# Patient Record
Sex: Female | Born: 2000 | Race: Black or African American | Hispanic: No | Marital: Single | Smoking: Current every day smoker
Health system: Southern US, Community
[De-identification: ages and names within clinical notes are randomized; demographics above are authoritative.]

## PROBLEM LIST (undated history)

## (undated) DIAGNOSIS — F1290 Cannabis use, unspecified, uncomplicated: Secondary | ICD-10-CM

## (undated) HISTORY — PX: NO PAST SURGERIES: SHX2092

---
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
## 2021-01-19 NOTE — Telephone Encounter (Signed)
Called [facility] back. The doctor wanted to make sure they had to correct information from the patient. The patient had told them she was waiting for [place] to mail her another monitor. Advised that patient was mailed a second monitor and already worn it and we are in the process of uploading the results. She was appreciative for the information. She will await the results to be uploaded and check in CareEverywhere for them.  I will upload tomorrow when I am at a computer where I can do that.

## 2021-01-19 NOTE — Telephone Encounter (Signed)
Please call to discuss monitor.[facility] is calling and states that patinetn is stating mmonitor was to be placed in the mail in December, and she has not received . Please call to discuss.

## 2021-01-19 NOTE — Telephone Encounter (Signed)
[facility] is returning your call to discuss in further detail.

## 2021-01-19 NOTE — Telephone Encounter (Signed)
Second report available on ZIO suite, it posted on ZIO on 01/08/21.  Will upload to Epic.   Left message with [facility] that we are in the process of uploading the second monitor and to call us back if they have any further questions.

## 2021-01-20 NOTE — Telephone Encounter (Signed)
Second monitor uploaded and assigned to Dr Mariah Milling to result.

## 2021-01-20 NOTE — Addendum Note (Signed)
Addended by: Jeannetta Nap on: 01/20/2021 08:22 AM   Modules accepted: Orders

## 2021-01-23 NOTE — Progress Notes (Signed)
Results reviewed with patient and she verbalized understanding with no further questions at this time.  °

## 2021-02-16 ENCOUNTER — Inpatient Hospital Stay: Payer: Self-pay | Admitting: Neurology

## 2021-05-14 ENCOUNTER — Other Ambulatory Visit: Payer: Self-pay

## 2021-05-14 ENCOUNTER — Emergency Department: Payer: Medicaid Other

## 2021-05-14 DIAGNOSIS — M79602 Pain in left arm: Secondary | ICD-10-CM | POA: Insufficient documentation

## 2021-05-14 DIAGNOSIS — R079 Chest pain, unspecified: Secondary | ICD-10-CM | POA: Insufficient documentation

## 2021-05-14 DIAGNOSIS — F17290 Nicotine dependence, other tobacco product, uncomplicated: Secondary | ICD-10-CM | POA: Insufficient documentation

## 2021-05-14 DIAGNOSIS — Z7982 Long term (current) use of aspirin: Secondary | ICD-10-CM | POA: Insufficient documentation

## 2021-05-14 LAB — BASIC METABOLIC PANEL
Anion gap: 8 (ref 5–15)
BUN: 5 mg/dL — ABNORMAL LOW (ref 6–20)
CO2: 25 mmol/L (ref 22–32)
Calcium: 9.1 mg/dL (ref 8.9–10.3)
Chloride: 106 mmol/L (ref 98–111)
Creatinine, Ser: 0.82 mg/dL (ref 0.44–1.00)
GFR, Estimated: >60 mL/min (ref >60)
Glucose, Bld: 82 mg/dL (ref 70–99)
Potassium: 3.5 mmol/L (ref 3.5–5.1)
Sodium: 139 mmol/L (ref 135–145)

## 2021-05-14 LAB — CBC
HCT: 38.2 % (ref 36.0–46.0)
Hemoglobin: 13.2 g/dL (ref 12.0–15.0)
MCH: 32.3 pg (ref 26.0–34.0)
MCHC: 34.6 g/dL (ref 30.0–36.0)
MCV: 93.4 fL (ref 80.0–100.0)
Platelets: 286 10*3/uL (ref 150–400)
RBC: 4.09 MIL/uL (ref 3.87–5.11)
RDW: 13.7 % (ref 11.5–15.5)
WBC: 4.2 10*3/uL (ref 4.0–10.5)
nRBC: 0 % (ref 0.0–0.2)

## 2021-05-14 LAB — TROPONIN I (HIGH SENSITIVITY): Troponin I (High Sensitivity): 4 ng/L (ref <18)

## 2021-05-14 IMAGING — CR DG CHEST 2V
1 series · 2 of 2 positions shown · non-contrast
Comparison: [DATE]

CLINICAL DATA: Left-sided chest pain for several hours, initial
encounter

EXAM:
CHEST - 2 VIEW

[Series 1: dg chest 2 view · 0.14mm/px · 2 of 2 slices shown]
[im 1/2]
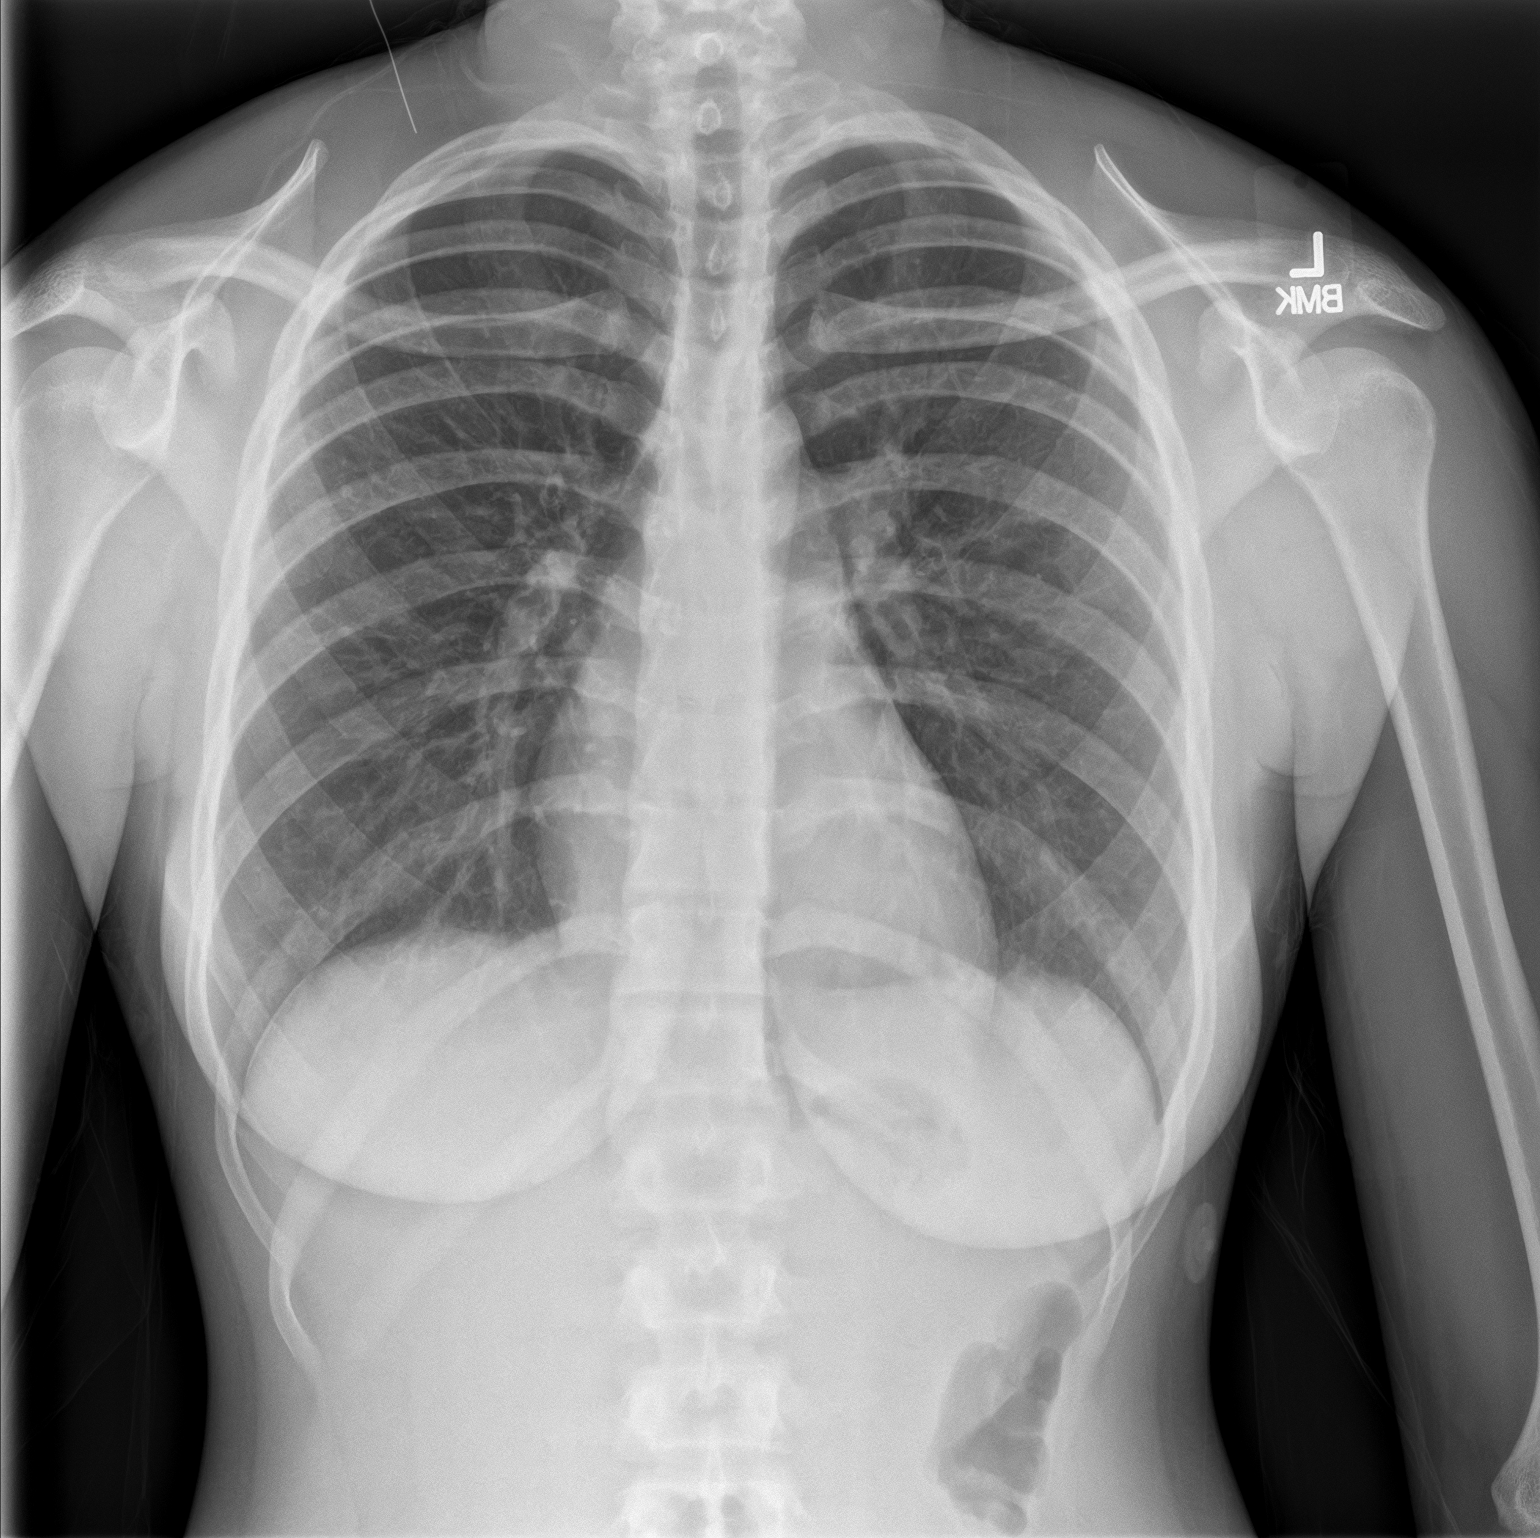
[im 2/2]
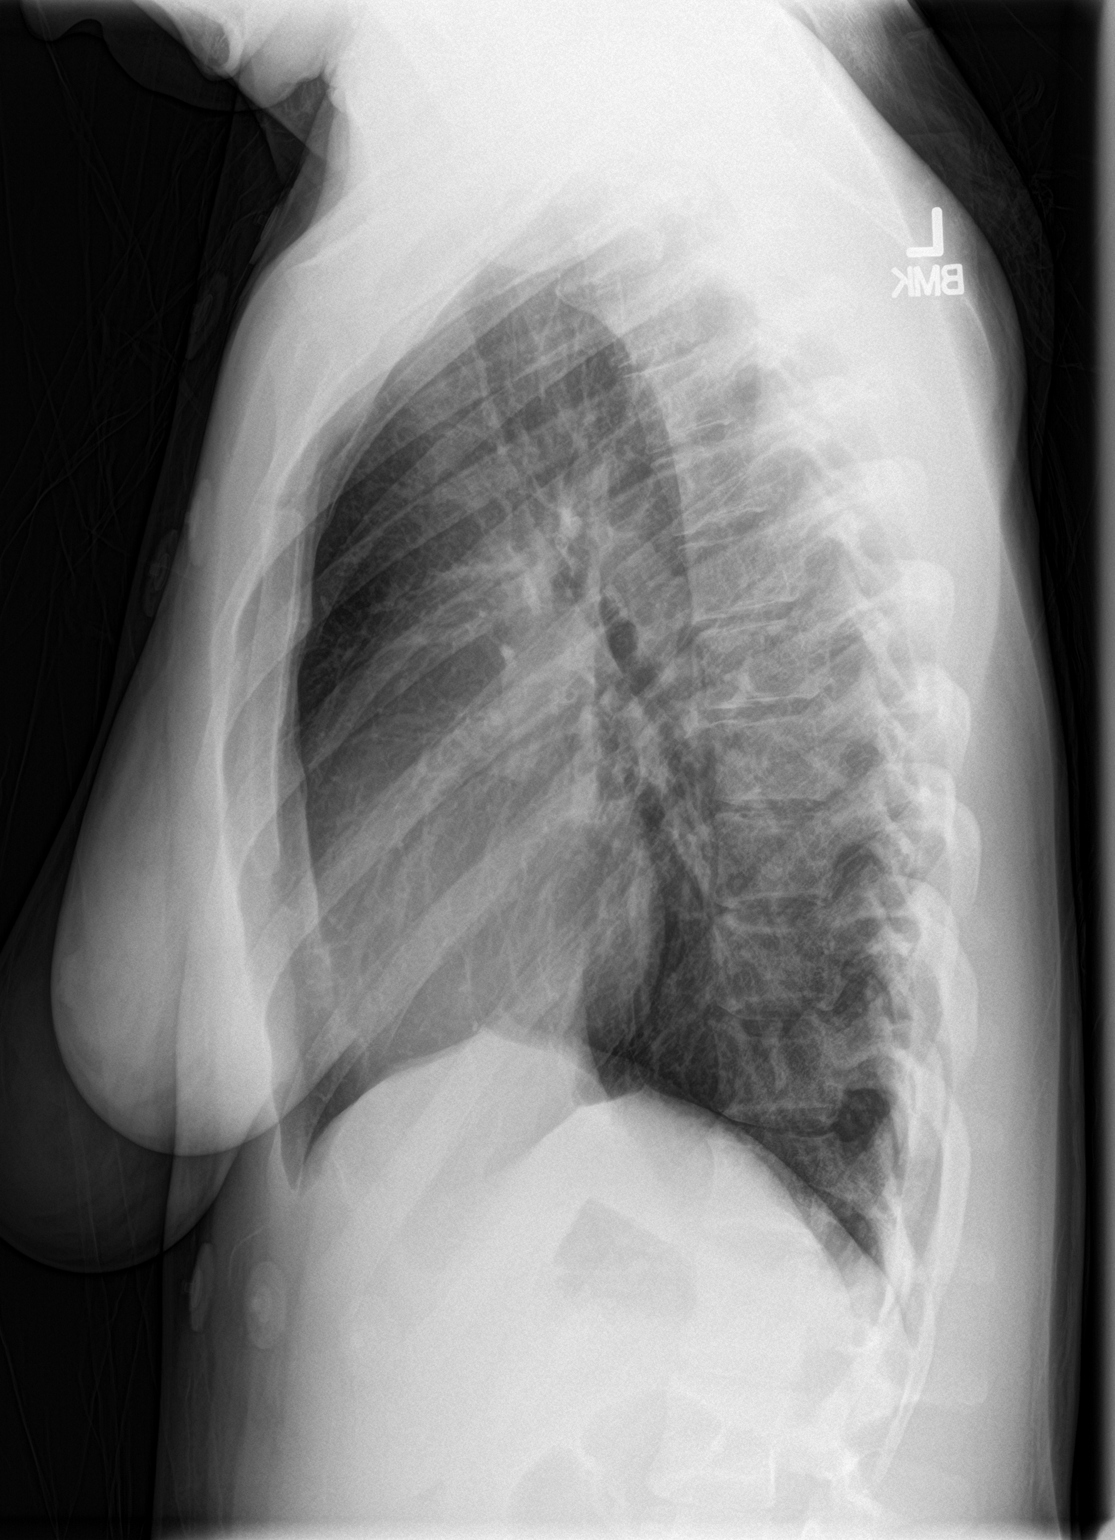

[2 of 2 positions shown; findings below may reference images not displayed]

FINDINGS: The heart size and mediastinal contours are within normal limits.
Both lungs are clear. The visualized skeletal structures are
unremarkable.
IMPRESSION: No active cardiopulmonary disease.

## 2021-05-14 NOTE — ED Triage Notes (Signed)
Pt with c/o left sided chest pain since 7 this evening. 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 Pt denies HA, double vision, or vision loss. Pt teary and upset. Pt denies N/V.

## 2021-05-14 NOTE — ED Notes (Signed)
Patient transported to X-ray, will get blood drawn when pt returns

## 2021-05-15 ENCOUNTER — Emergency Department
Admission: EM | Admit: 2021-05-15 | Discharge: 2021-05-15 | Disposition: A | Discharge status: Home / Self Care | Payer: Medicaid Other | Attending: Emergency Medicine | Admitting: Emergency Medicine

## 2021-05-15 DIAGNOSIS — R0789 Other chest pain: Secondary | ICD-10-CM

## 2021-05-15 LAB — TROPONIN I (HIGH SENSITIVITY): Troponin I (High Sensitivity): 3 ng/L (ref <18)

## 2021-05-15 NOTE — ED Provider Notes (Signed)
[facility] Emergency Department Provider Note  Time seen: 4:21 AM  I have reviewed the triage vital signs and the nursing notes.   HISTORY  Chief Complaint Chest Pain   HPI Jennifer Arellano is a 20 y.o. female presents to the emergency department for left chest pain.  According to the patient yesterday she developed pain in her left chest states she did do 100 sit ups the day before.  States the pain is reproducible if she pushes on it or moves her left arm.  Patient was concerned however because she was diagnosed with a stroke last year she wanted to make sure her heart was okay so she came to the emergency department for evaluation.  Patient denies any shortness of breath nausea or diaphoresis.  Describes the pain as mild to moderate worse with movement or palpation.   Past Medical History:  Diagnosis Date  . Cannabis use, uncomplicated     Patient Active Problem List   Diagnosis Date Noted  . Left oculomotor nerve palsy   . Marijuana abuse   . Vaping nicotine dependence, tobacco product   . 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 Dehydration 12/01/2020    Past Surgical History:  Procedure Laterality Date  . NO PAST SURGERIES    . TEE WITHOUT CARDIOVERSION N/A 12/04/2020   Procedure: TRANSESOPHAGEAL ECHOCARDIOGRAM (TEE);  Surgeon: Antonieta Iba, MD;  Location: [facility] ORS;  Service: Cardiovascular;  Laterality: N/A;    Prior to Admission medications   Medication Sig Start Date End Date Taking? Authorizing Provider  aspirin EC 81 MG EC tablet Take 1 tablet (81 mg total) by mouth daily. Swallow whole. 12/04/20   Lynn Ito, MD  rosuvastatin (CRESTOR) 20 MG tablet Take 1 tablet (20 mg total) by mouth daily at 6 PM. 12/04/20 01/03/21  Lynn Ito, MD    No Known Allergies  Family History  Problem Relation Age of Onset  . High blood pressure Mother   . Asthma Father   . Stroke Maternal Grandmother   . Fibromyalgia Maternal Grandmother   . Arthritis  Maternal Grandmother   . ADD / ADHD Sister     Social History Social History   Tobacco Use  . Smoking status: Current Every Day Smoker    Types: E-cigarettes  . Smokeless tobacco: Never Used  Substance Use Topics  . Alcohol use: Yes  . Drug use: Yes    Types: Marijuana    Review of Systems Constitutional: Negative for fever. Cardiovascular: Mild to moderate dull left chest pain Respiratory: Negative for shortness of breath. Gastrointestinal: Negative for abdominal pain Musculoskeletal: Negative for musculoskeletal complaints Neurological: Negative for headache All other ROS negative  ____________________________________________   PHYSICAL EXAM:  VITAL SIGNS: ED Triage Vitals  Enc Vitals Group     BP 05/14/21 2218 130/86     Pulse Rate 05/14/21 2218 79     Resp 05/14/21 2218 20     Temp 05/14/21 2218 98.3 F (36.8 C)     Temp Source 05/14/21 2218 Oral     SpO2 05/14/21 2218 97 %     Weight 05/14/21 2220 135 lb (61.2 kg)     Height 05/14/21 2220 5\' 5"  (1.651 m)     Head Circumference --      Peak Flow --      Pain Score 05/14/21 2220 6     Pain Loc --      Pain Edu? --      Excl. in GC? --  Constitutional: Alert and oriented. Well appearing and in no distress. Eyes: Normal exam ENT      Head: Normocephalic and atraumatic.      Mouth/Throat: Mucous membranes are moist. Cardiovascular: Normal rate, regular rhythm.  Respiratory: Normal respiratory effort without tachypnea nor retractions. Breath sounds are clear.  Chest pain is reproducible to palpation of the left chest. Gastrointestinal: Soft and nontender. No distention.  Musculoskeletal: Nontender with normal range of motion in all extremities.  Neurologic:  Normal speech and language. No gross focal neurologic deficits  Skin:  Skin is warm, dry and intact.  Psychiatric: Mood and affect are normal.   ____________________________________________    EKG  EKG viewed and interpreted by myself shows a  normal sinus rhythm at 65 bpm with a narrow QRS, normal axis, normal intervals, no concerning ST changes.  ____________________________________________    RADIOLOGY  Chest x-ray is negative  ____________________________________________   INITIAL IMPRESSION / ASSESSMENT AND PLAN / ED COURSE  Pertinent labs & imaging results that were available during my care of the patient were reviewed by me and considered in my medical decision making (see chart for details).   Patient presents emergency department for left chest pain worse with movement or palpation.  Reproducible on exam.  Patient's work-up is reassuring, EKG is normal, chest x-ray is normal, lab work is reassuring including negative troponin x2.  Given the reproducibility of the pain highly suspect musculoskeletal discomfort/chest wall pain.  Discussed with patient Tylenol or ibuprofen as needed for discomfort and PCP follow-up.  Patient agreeable to plan of care.  Jennifer Arellano was evaluated in Emergency Department on 05/15/2021 for the symptoms described in the history of present illness. She was evaluated in the context of the global COVID-19 pandemic, which necessitated consideration that the patient might be at risk for infection with the SARS-CoV-2 virus that causes COVID-19. Institutional protocols and algorithms that pertain to the evaluation of patients at risk for COVID-19 are in a state of rapid change based on information released by regulatory bodies including the CDC and federal and state organizations. These policies and algorithms were followed during the patient's care in the ED.  ____________________________________________   FINAL CLINICAL IMPRESSION(S) / ED DIAGNOSES  Chest pain   Minna Antis, MD 05/15/21 [phone]

## 2021-05-23 ENCOUNTER — Other Ambulatory Visit: Payer: Self-pay

## 2021-05-23 ENCOUNTER — Emergency Department: Payer: Medicaid Other

## 2021-05-23 ENCOUNTER — Encounter: Payer: Self-pay | Admitting: Intensive Care

## 2021-05-23 ENCOUNTER — Emergency Department
Admission: EM | Admit: 2021-05-23 | Discharge: 2021-05-23 | Discharge status: Against Medical Advice | Payer: Medicaid Other | Attending: Emergency Medicine | Admitting: Emergency Medicine

## 2021-05-23 DIAGNOSIS — I639 Cerebral infarction, unspecified: Secondary | ICD-10-CM | POA: Diagnosis not present

## 2021-05-23 DIAGNOSIS — Z8673 Personal history of transient ischemic attack (TIA), and cerebral infarction without residual deficits: Secondary | ICD-10-CM | POA: Diagnosis not present

## 2021-05-23 DIAGNOSIS — H538 Other visual disturbances: Secondary | ICD-10-CM | POA: Diagnosis present

## 2021-05-23 DIAGNOSIS — I671 Cerebral aneurysm, nonruptured: Secondary | ICD-10-CM | POA: Diagnosis not present

## 2021-05-23 DIAGNOSIS — F17290 Nicotine dependence, other tobacco product, uncomplicated: Secondary | ICD-10-CM | POA: Diagnosis not present

## 2021-05-23 DIAGNOSIS — Z7982 Long term (current) use of aspirin: Secondary | ICD-10-CM | POA: Insufficient documentation

## 2021-05-23 DIAGNOSIS — G459 Transient cerebral ischemic attack, unspecified: Secondary | ICD-10-CM | POA: Diagnosis not present

## 2021-05-23 LAB — DIFFERENTIAL
Abs Immature Granulocytes: 0.01 10*3/uL (ref 0.00–0.07)
Basophils Absolute: 0 10*3/uL (ref 0.0–0.1)
Basophils Relative: 1 %
Eosinophils Absolute: 0.1 10*3/uL (ref 0.0–0.5)
Eosinophils Relative: 3 %
Immature Granulocytes: 0 %
Lymphocytes Relative: 42 %
Lymphs Abs: 1.9 10*3/uL (ref 0.7–4.0)
Monocytes Absolute: 0.5 10*3/uL (ref 0.1–1.0)
Monocytes Relative: 11 %
Neutro Abs: 1.9 10*3/uL (ref 1.7–7.7)
Neutrophils Relative %: 43 %

## 2021-05-23 LAB — COMPREHENSIVE METABOLIC PANEL
ALT: 13 U/L (ref 0–44)
AST: 16 U/L (ref 15–41)
Albumin: 3.7 g/dL (ref 3.5–5.0)
Alkaline Phosphatase: 53 U/L (ref 38–126)
Anion gap: 7 (ref 5–15)
BUN: 10 mg/dL (ref 6–20)
CO2: 22 mmol/L (ref 22–32)
Calcium: 8.9 mg/dL (ref 8.9–10.3)
Chloride: 109 mmol/L (ref 98–111)
Creatinine, Ser: 0.8 mg/dL (ref 0.44–1.00)
GFR, Estimated: >60 mL/min (ref >60)
Glucose, Bld: 95 mg/dL (ref 70–99)
Potassium: 3.9 mmol/L (ref 3.5–5.1)
Sodium: 138 mmol/L (ref 135–145)
Total Bilirubin: 0.8 mg/dL (ref 0.3–1.2)
Total Protein: 7.3 g/dL (ref 6.5–8.1)

## 2021-05-23 LAB — CBG MONITORING, ED: Glucose-Capillary: 89 mg/dL (ref 70–99)

## 2021-05-23 LAB — CBC
HCT: 39.9 % (ref 36.0–46.0)
Hemoglobin: 13.7 g/dL (ref 12.0–15.0)
MCH: 31.9 pg (ref 26.0–34.0)
MCHC: 34.3 g/dL (ref 30.0–36.0)
MCV: 92.8 fL (ref 80.0–100.0)
Platelets: 267 10*3/uL (ref 150–400)
RBC: 4.3 MIL/uL (ref 3.87–5.11)
RDW: 13.3 % (ref 11.5–15.5)
WBC: 4.4 10*3/uL (ref 4.0–10.5)
nRBC: 0 % (ref 0.0–0.2)

## 2021-05-23 LAB — PROTIME-INR
INR: 1.1 (ref 0.8–1.2)
Prothrombin Time: 14.3 seconds (ref 11.4–15.2)

## 2021-05-23 LAB — APTT: aPTT: 29 seconds (ref 24–36)

## 2021-05-23 IMAGING — MR MR HEAD W/O CM
11 series · 40 of 48 positions shown · non-contrast
Comparison: No pertinent prior exam.
COMPARISON: Prior CT from earlier same day as well as previous MRI
from [DATE].

CLINICAL DATA: Initial evaluation for neuro deficit, stroke
suspected

EXAM:
MRI HEAD WITHOUT CONTRAST
MRA HEAD WITHOUT CONTRAST
TECHNIQUE: Multiplanar, multi-echo pulse sequences of the brain and surrounding
structures were acquired without intravenous contrast. Angiographic
images of the Circle of Willis were acquired using MRA technique
without intravenous contrast.

[Series 5: ax dwi_tracew · axial · 3.0mm · 0.65mm/px · z∈[-114,+35]mm · 5 of 48 slices shown]
[im 1/48]
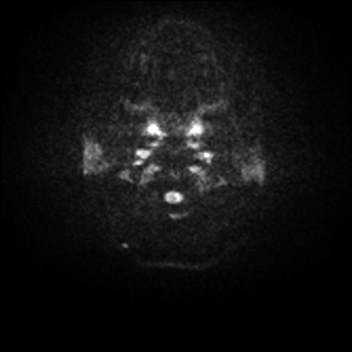
[im 12/48]
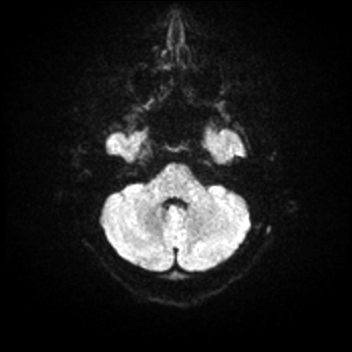
[im 24/48]
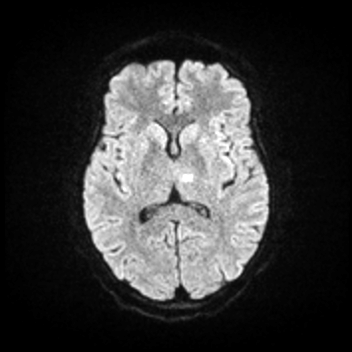
[im 36/48]
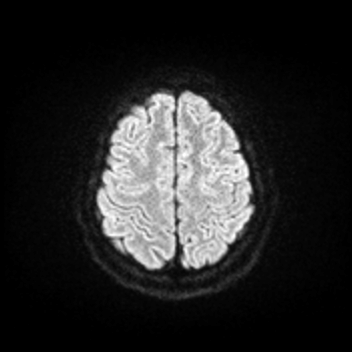
[im 48/48]
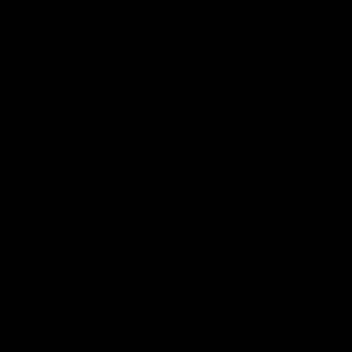

[Series 6: ax dwi_adc · axial · 3.0mm · 0.65mm/px · z∈[-114,+25]mm · 4 of 44 slices shown]
[im 1/44]
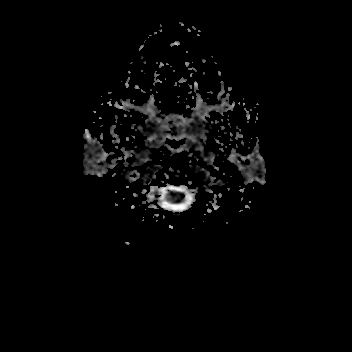
[im 15/44]
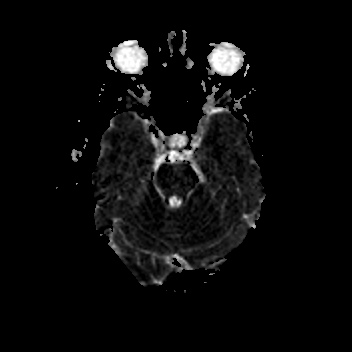
[im 29/44]
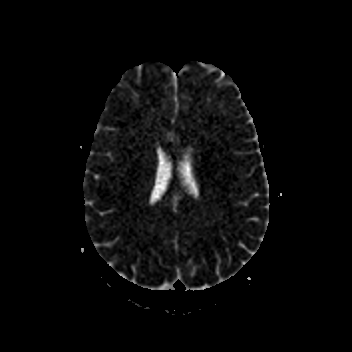
[im 44/44]
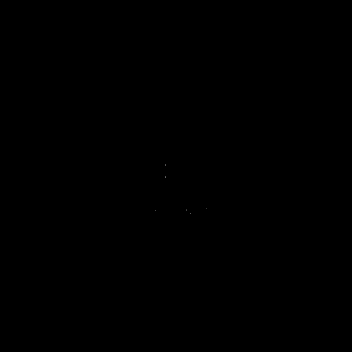

[Series 7: cor dwi_tracew · coronal · 5.0mm · 0.68mm/px · 3 of 40 slices shown]
[im 1/40]
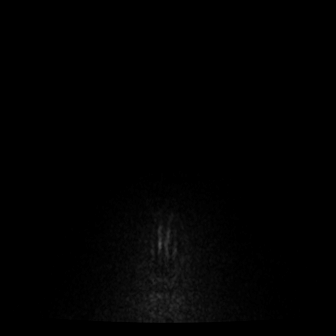
[im 20/40]
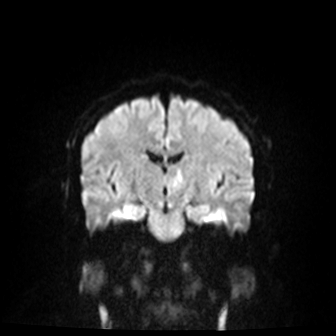
[im 40/40]
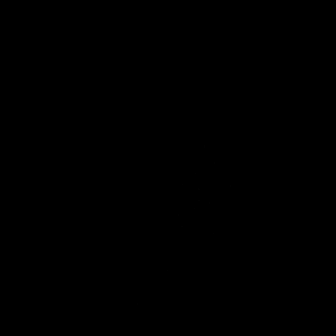

[Series 8: cor dwi_adc · coronal · 5.0mm · 0.68mm/px · 3 of 39 slices shown]
[im 1/39]
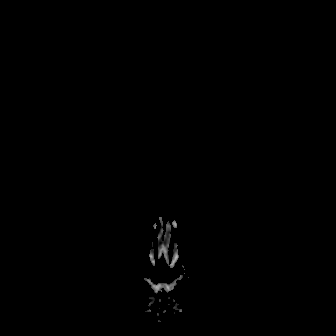
[im 20/39]
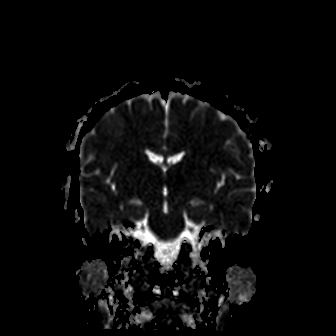
[im 39/39]
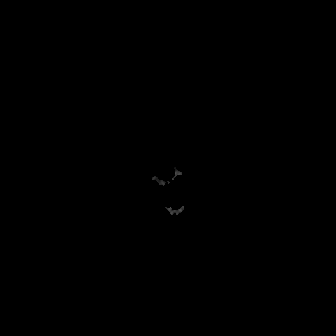

[Series 9: T1 · sagittal · 5.0mm · 0.62mm/px · 2 of 25 slices shown (1 of 2)]
[im 1/25]
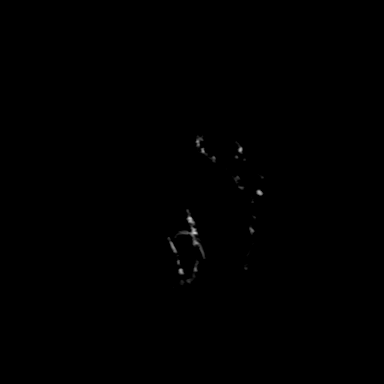
[im 25/25]
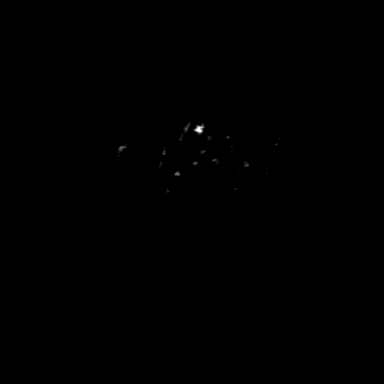

[Series 15: T2 · axial · 5.0mm · 0.53mm/px · z∈[-108,+31]mm · 2 of 25 slices shown (1 of 2)]
[im 1/25]
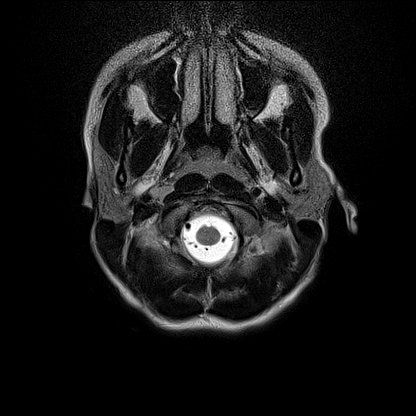
[im 25/25]
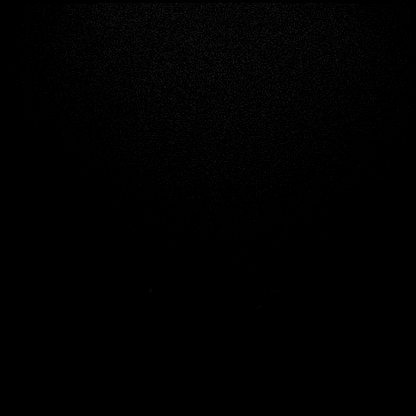

[Series 17: pha_images · axial · 3.0mm · 0.90mm/px · z∈[-113,+25]mm · 4 of 49 slices shown]
[im 1/49]
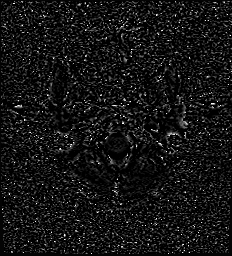
[im 17/49]
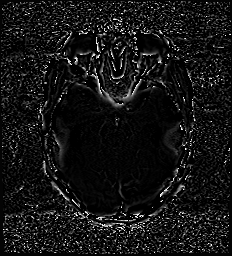
[im 33/49]
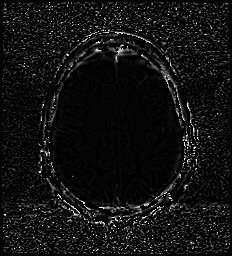
[im 49/49]
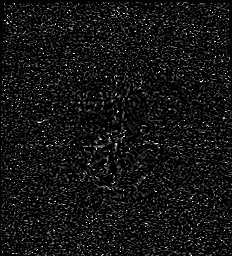

[Series 18: swi_images · axial · 3.0mm · 0.90mm/px · z∈[-113,-64]mm · 2 of 52 slices shown]
[im 1/52]
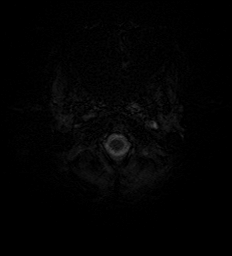
[im 18/52]
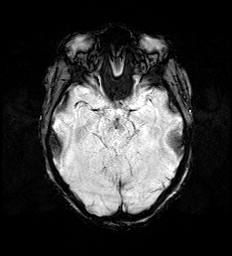

[Series 20: FLAIR · axial · 3.0mm · 0.53mm/px · z∈[-116,+39]mm · 5 of 55 slices shown]
[im 1/55]
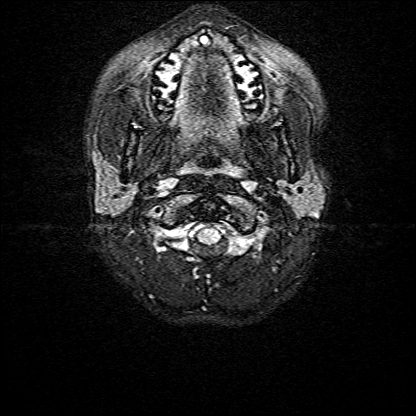
[im 14/55]
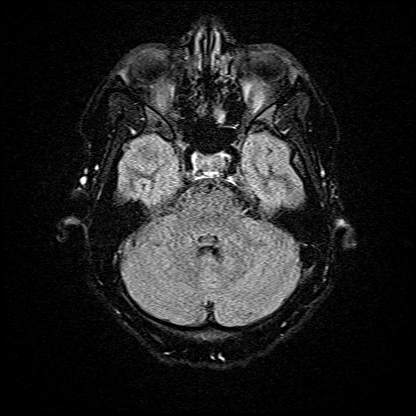
[im 28/55]
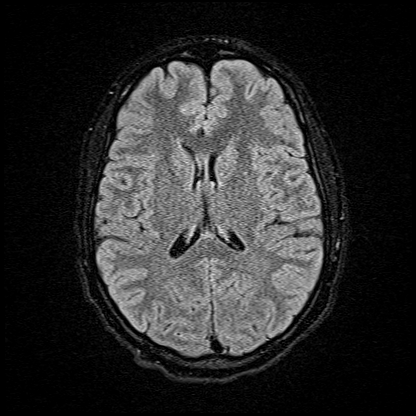
[im 41/55]
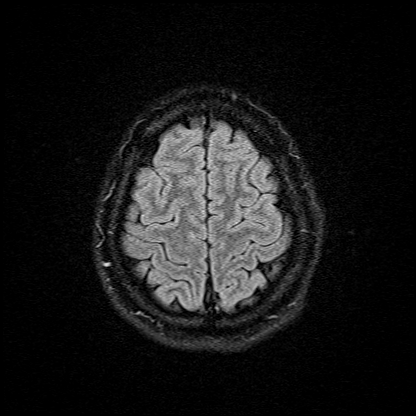
[im 55/55]
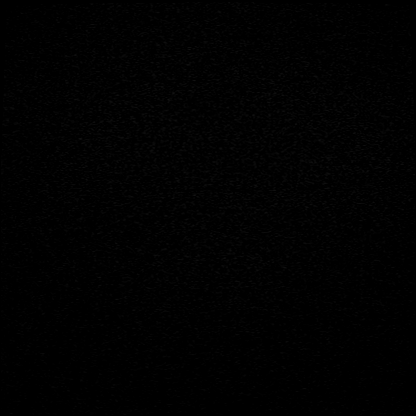

[Series 21: T1 · axial · 1.0mm · 0.98mm/px · z∈[-127,+39]mm · 8 of 173 slices shown (2 of 2)]
[im 1/173]
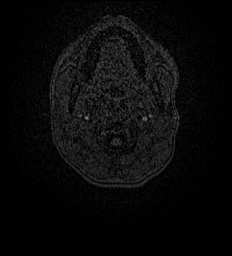
[im 27/173]
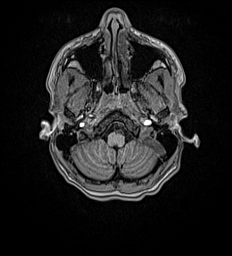
[im 53/173]
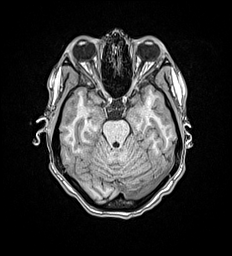
[im 80/173]
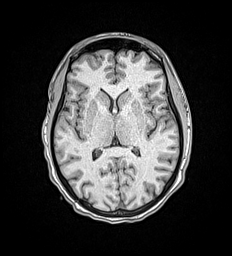
[im 93/173]
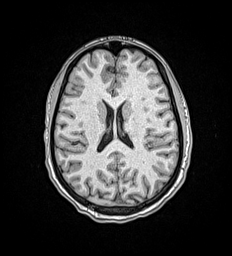
[im 120/173]
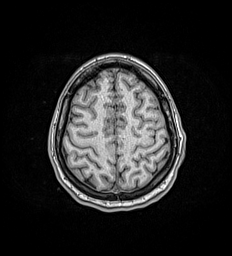
[im 146/173]
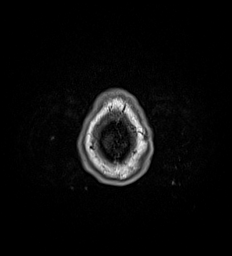
[im 173/173]
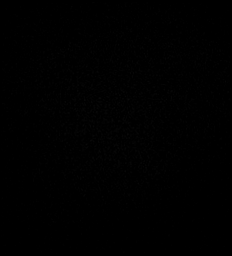

[Series 22: T2 · coronal · 5.0mm · 0.57mm/px · 2 of 29 slices shown (2 of 2)]
[im 1/29]
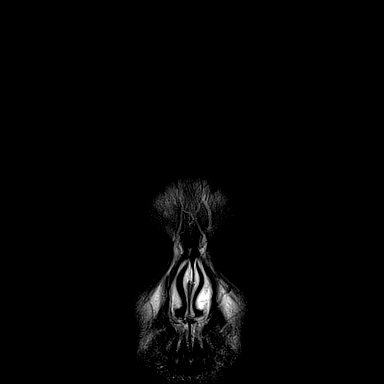
[im 29/29]
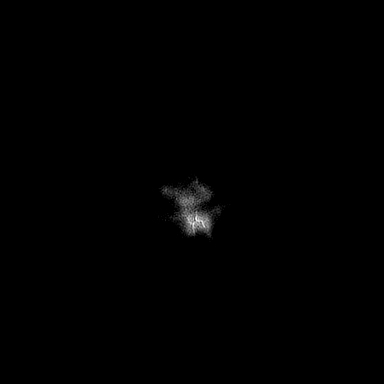

[40 of 48 positions shown; findings below may reference images not displayed]

FINDINGS: MRI HEAD FINDINGS

Brain: Cerebral volume within normal limits.

There is an approximate 1.5 cm area mild diffusion abnormality
involving the ventral medial left thalamus (series 5, image 24).
Associated signal loss seen on corresponding ADC map (series 6,
image 24). Finding consistent with an acute ischemic infarct. No
associated hemorrhage or mass effect. No other diffusion abnormality
to suggest acute or subacute ischemia. Gray-white matter
differentiation maintained. No residual signal abnormality seen at
the level of the patient's previous midbrain infarct. No evidence
for acute or chronic intracranial hemorrhage.

No mass lesion, midline shift or mass effect. No hydrocephalus or
extra-axial fluid collection. Pituitary gland suprasellar region
normal. Midline structures intact.

Vascular: Major intracranial vascular flow voids are maintained.

Skull and upper cervical spine: Craniocervical junction within
normal limits. Bone marrow signal intensity normal. No scalp soft
tissue abnormality.

Sinuses/Orbits: Globes and orbital soft tissues demonstrate no acute
finding. Small right maxillary sinus retention cyst noted. Paranasal
sinuses are otherwise clear. No mastoid effusion. Inner ear
structures grossly normal.

Other: None.

MRA HEAD FINDINGS

ANTERIOR CIRCULATION:

Examination mildly degraded by motion artifact.

Visualized distal cervical segments of the internal carotid arteries
are widely patent with antegrade flow. Petrous, cavernous, and
supraclinoid segments widely patent without stenosis or other
abnormality. A1 segments patent bilaterally, with the right being
slightly hypoplastic. Normal anterior communicating artery complex.
Anterior cerebral arteries patent to their distal aspects without
stenosis. No M1 stenosis or occlusion. Normal MCA bifurcations.
Distal MCA branches well perfused and symmetric.

POSTERIOR CIRCULATION:

Dominant right vertebral artery widely patent to the vertebrobasilar
junction. Left V4 segment not well seen beyond the takeoff of the
left PICA on this exam. Both PICA origins are patent and normal.
Basilar patent to its distal aspect without stenosis. Superior
cerebellar arteries patent bilaterally. Both PCA supplied via the
basilar as well as robust bilateral posterior communicating
arteries. PCAs remain patent to their distal aspects without
stenosis.

No intracranial aneurysm.
IMPRESSION: MRI HEAD IMPRESSION:

1. 1.5 cm acute ischemic nonhemorrhagic infarct involving the
ventromedial left thalamus.
2. Otherwise normal brain MRI.

MRA HEAD IMPRESSION:

Grossly stable intracranial MRA. Distal left V4 segment not well
visualized on this exam, favored to be artifactual. No large vessel
occlusion or hemodynamically significant stenosis.

## 2021-05-23 IMAGING — MR MR MRA HEAD W/O CM
1 series · 18 of 48 positions shown · non-contrast
Comparison: No pertinent prior exam.
COMPARISON: Prior CT from earlier same day as well as previous MRI
from [DATE].

CLINICAL DATA: Initial evaluation for neuro deficit, stroke
suspected

EXAM:
MRI HEAD WITHOUT CONTRAST
MRA HEAD WITHOUT CONTRAST
TECHNIQUE: Multiplanar, multi-echo pulse sequences of the brain and surrounding
structures were acquired without intravenous contrast. Angiographic
images of the Circle of Willis were acquired using MRA technique
without intravenous contrast.

[Series 23: TOF · axial · 0.5mm · 0.41mm/px · z∈[-98,-0]mm · 18 of 205 slices shown]
[im 1/205]
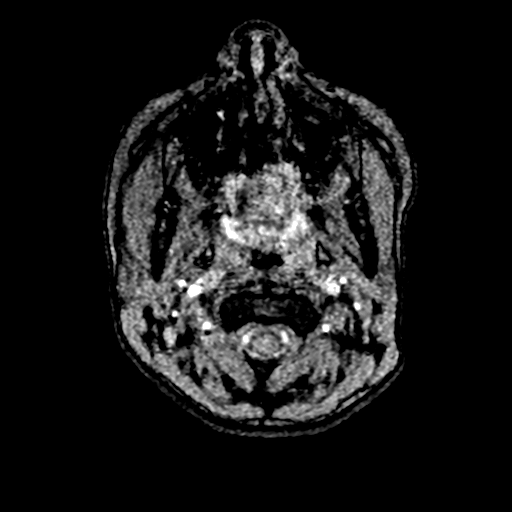
[im 5/205]
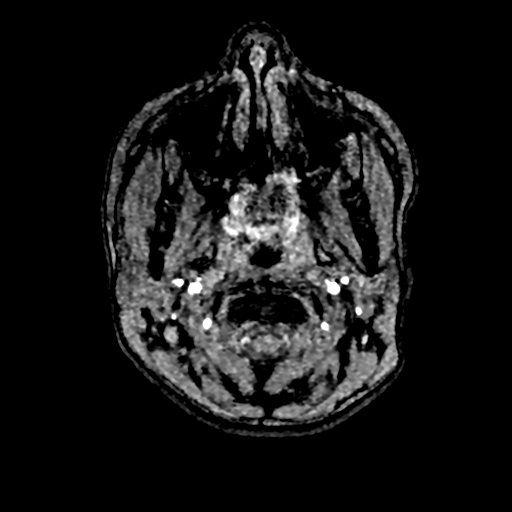
[im 9/205]
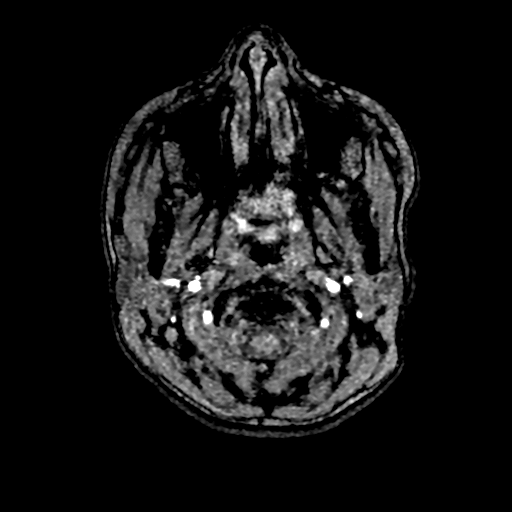
[im 14/205]
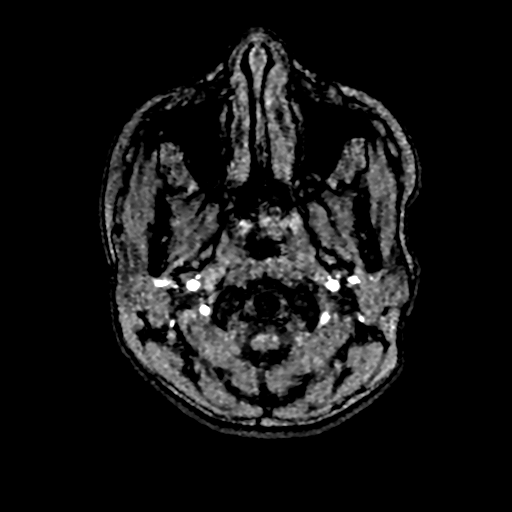
[im 18/205]
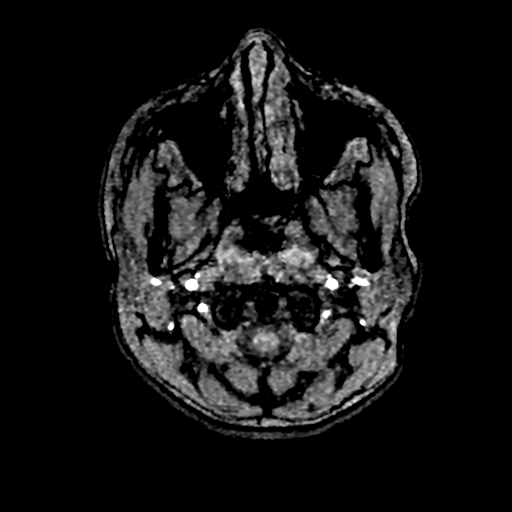
[im 22/205]
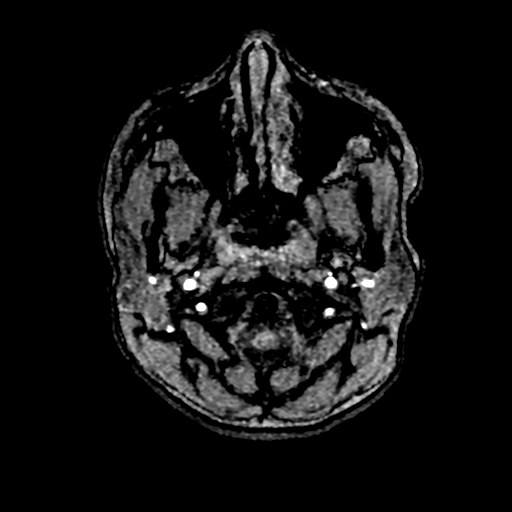
[im 27/205]
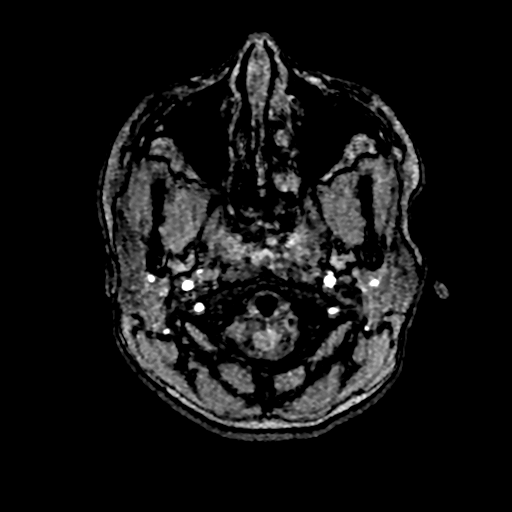
[im 31/205]
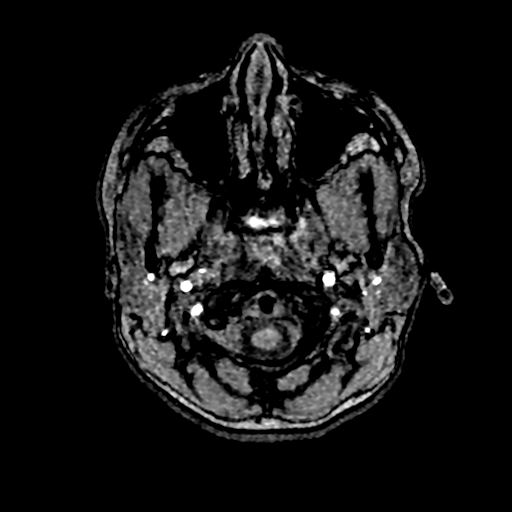
[im 35/205]
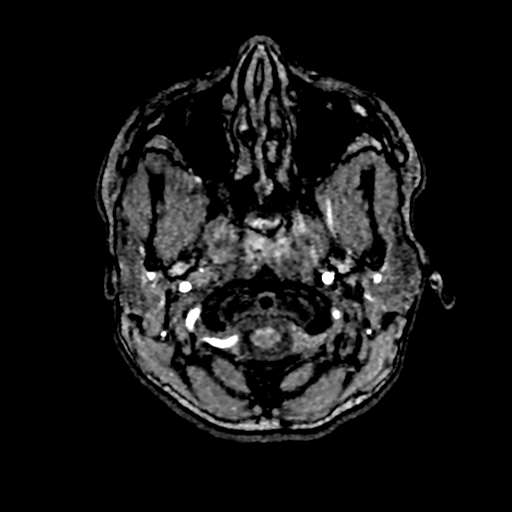
[im 40/205]
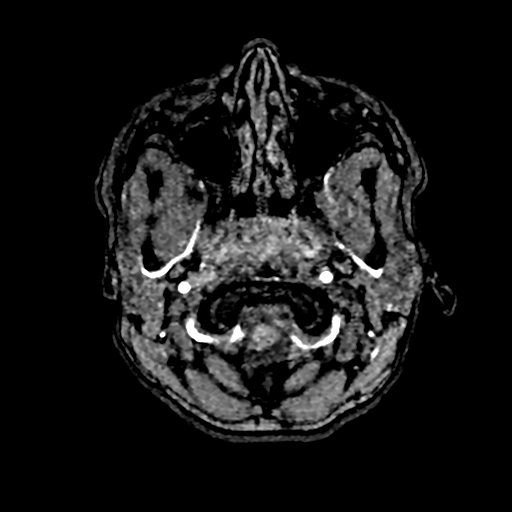
[im 66/205]
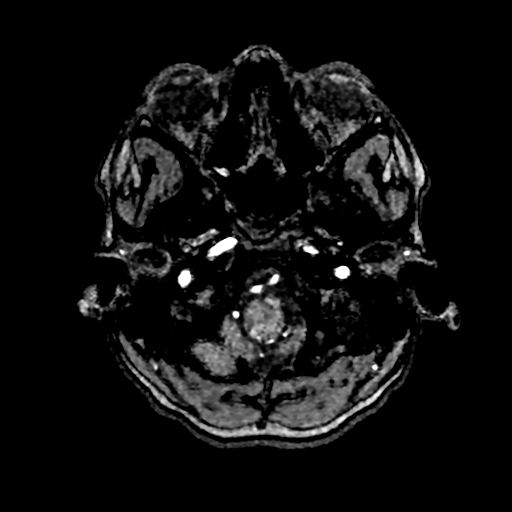
[im 92/205]
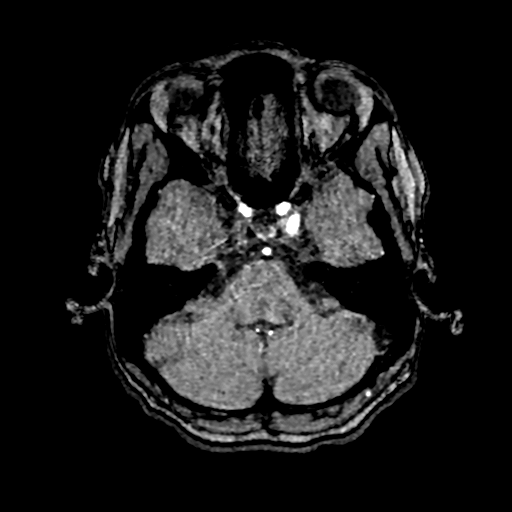
[im 105/205]
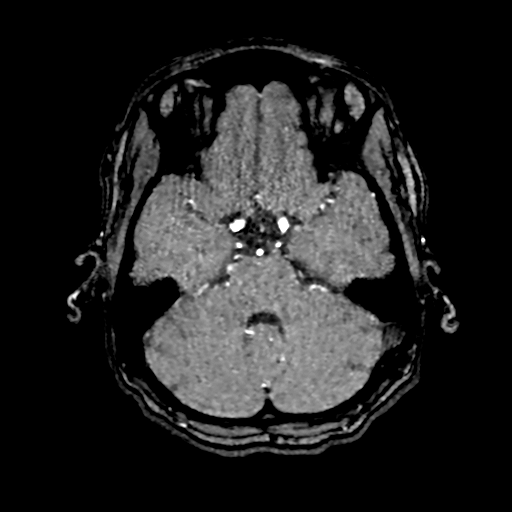
[im 118/205]
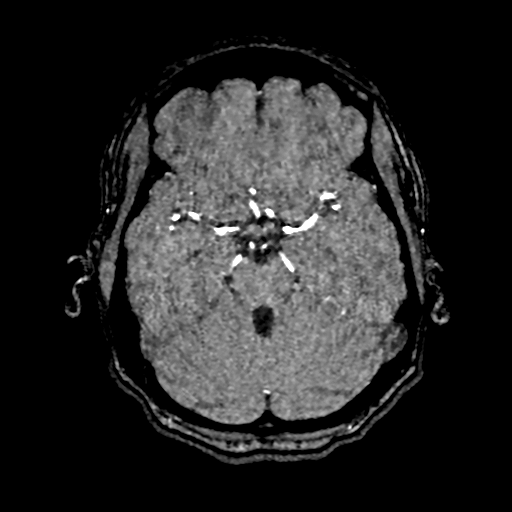
[im 144/205]
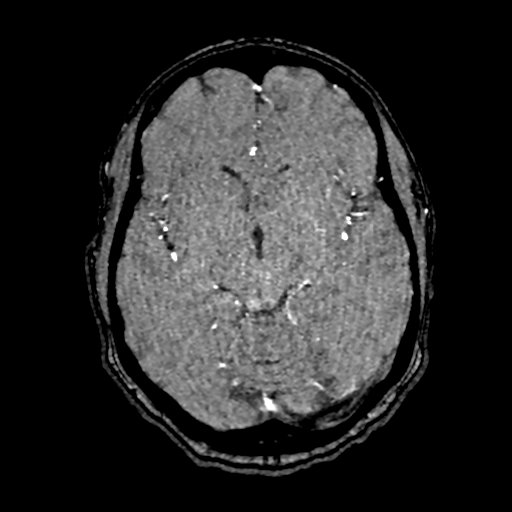
[im 170/205]
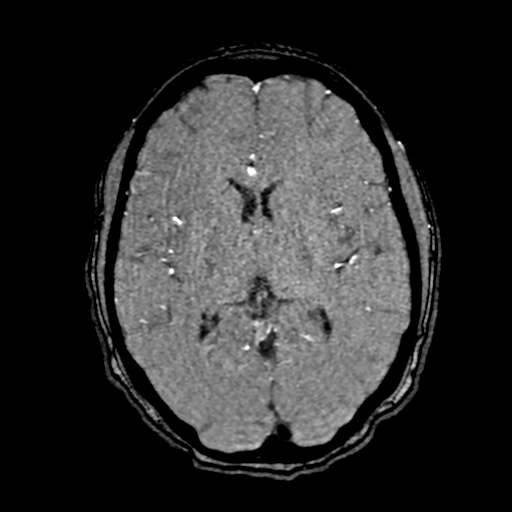
[im 174/205]
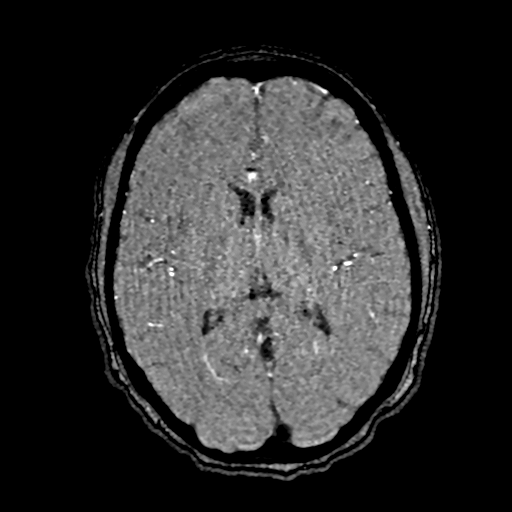
[im 196/205]
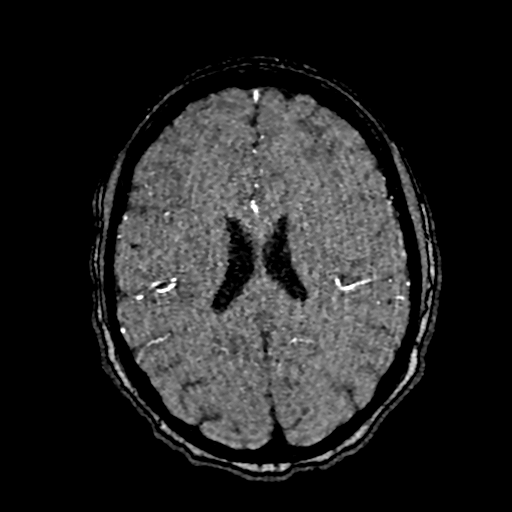

[18 of 48 positions shown; findings below may reference images not displayed]

FINDINGS: MRI HEAD FINDINGS

Brain: Cerebral volume within normal limits.

There is an approximate 1.5 cm area mild diffusion abnormality
involving the ventral medial left thalamus (series 5, image 24).
Associated signal loss seen on corresponding ADC map (series 6,
image 24). Finding consistent with an acute ischemic infarct. No
associated hemorrhage or mass effect. No other diffusion abnormality
to suggest acute or subacute ischemia. Gray-white matter
differentiation maintained. No residual signal abnormality seen at
the level of the patient's previous midbrain infarct. No evidence
for acute or chronic intracranial hemorrhage.

No mass lesion, midline shift or mass effect. No hydrocephalus or
extra-axial fluid collection. Pituitary gland suprasellar region
normal. Midline structures intact.

Vascular: Major intracranial vascular flow voids are maintained.

Skull and upper cervical spine: Craniocervical junction within
normal limits. Bone marrow signal intensity normal. No scalp soft
tissue abnormality.

Sinuses/Orbits: Globes and orbital soft tissues demonstrate no acute
finding. Small right maxillary sinus retention cyst noted. Paranasal
sinuses are otherwise clear. No mastoid effusion. Inner ear
structures grossly normal.

Other: None.

MRA HEAD FINDINGS

ANTERIOR CIRCULATION:

Examination mildly degraded by motion artifact.

Visualized distal cervical segments of the internal carotid arteries
are widely patent with antegrade flow. Petrous, cavernous, and
supraclinoid segments widely patent without stenosis or other
abnormality. A1 segments patent bilaterally, with the right being
slightly hypoplastic. Normal anterior communicating artery complex.
Anterior cerebral arteries patent to their distal aspects without
stenosis. No M1 stenosis or occlusion. Normal MCA bifurcations.
Distal MCA branches well perfused and symmetric.

POSTERIOR CIRCULATION:

Dominant right vertebral artery widely patent to the vertebrobasilar
junction. Left V4 segment not well seen beyond the takeoff of the
left PICA on this exam. Both PICA origins are patent and normal.
Basilar patent to its distal aspect without stenosis. Superior
cerebellar arteries patent bilaterally. Both PCA supplied via the
basilar as well as robust bilateral posterior communicating
arteries. PCAs remain patent to their distal aspects without
stenosis.

No intracranial aneurysm.
IMPRESSION: MRI HEAD IMPRESSION:

1. 1.5 cm acute ischemic nonhemorrhagic infarct involving the
ventromedial left thalamus.
2. Otherwise normal brain MRI.

MRA HEAD IMPRESSION:

Grossly stable intracranial MRA. Distal left V4 segment not well
visualized on this exam, favored to be artifactual. No large vessel
occlusion or hemodynamically significant stenosis.

## 2021-05-23 IMAGING — CT CT HEAD CODE STROKE
3 series · 14 of 46 positions shown, 16 images · non-contrast
Comparison: CT [DATE].

CLINICAL DATA: Code stroke.  Neuro deficit, acute stroke suspected.

EXAM:
CT HEAD WITHOUT CONTRAST
TECHNIQUE: Contiguous axial images were obtained from the base of the skull
through the vertex without intravenous contrast.

[Series 4: coronal soft tissue · coronal · 0.29mm/px · 3 of 62 slices shown]
[im 21/62  brain]
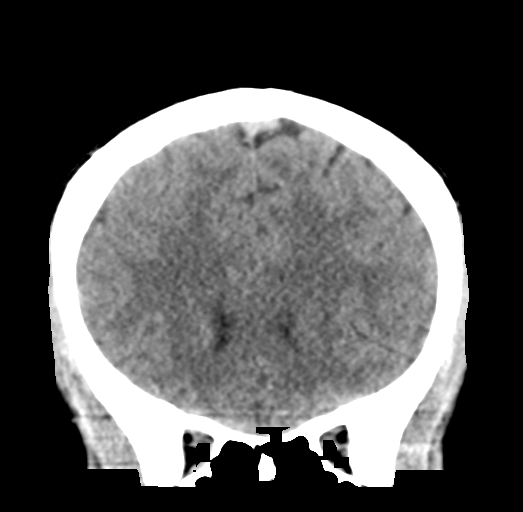
[im 28/62  brain]
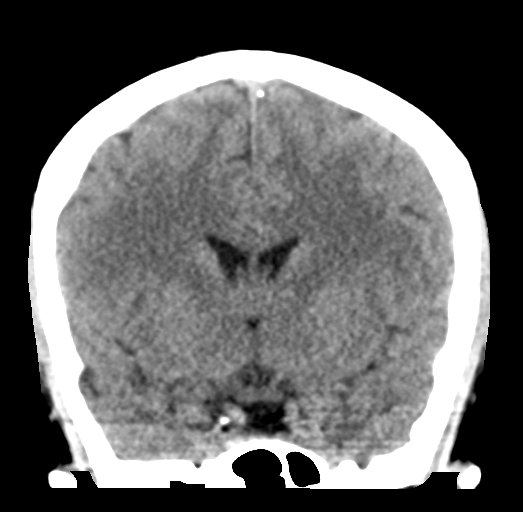
[im 34/62  brain]
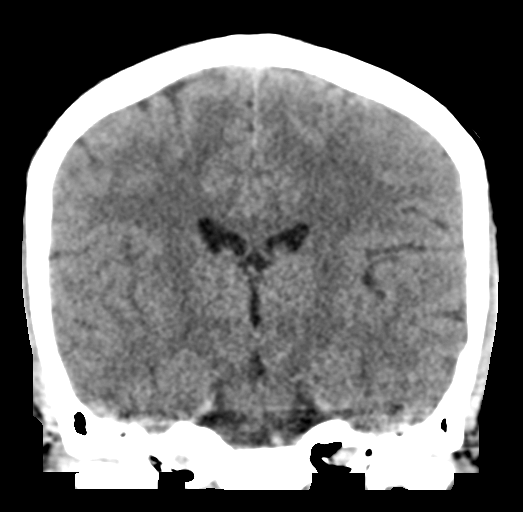

[Series 5: ax head wo · axial · 0.29mm/px · z∈[-170,-52]mm · 8 of 29 slices shown, 10 images]
[im 3/29  brain]
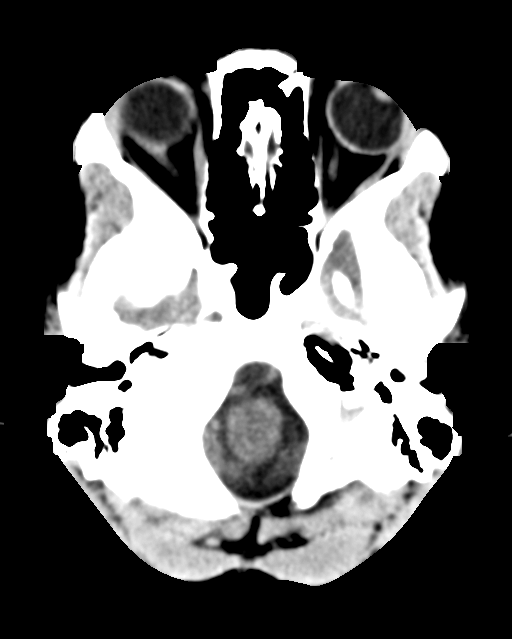
[im 3/29  bone]
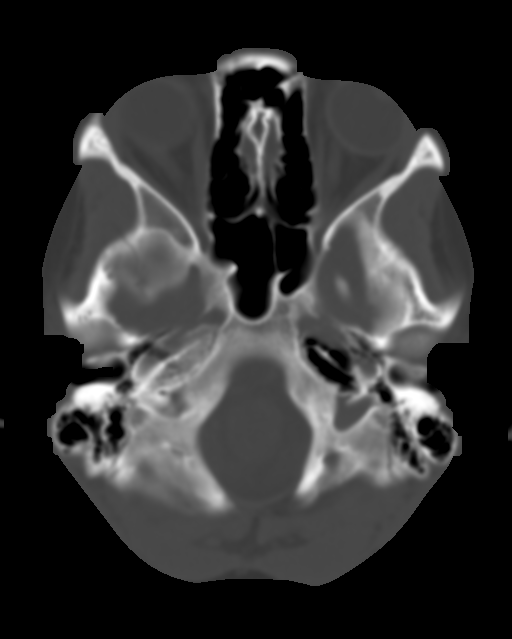
[im 7/29  brain]
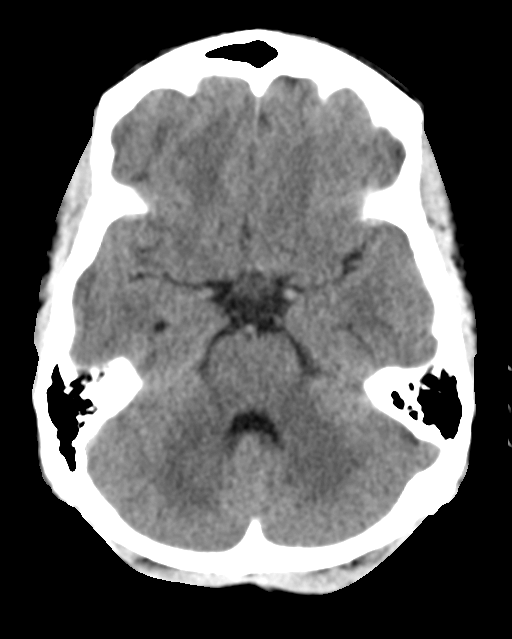
[im 10/29  brain]
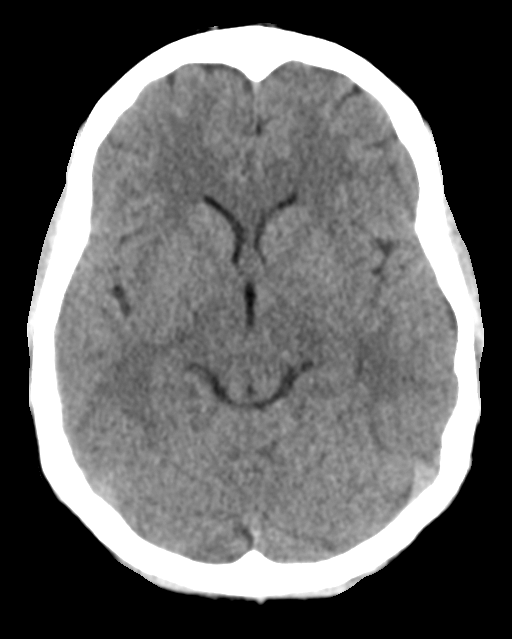
[im 13/29  brain]
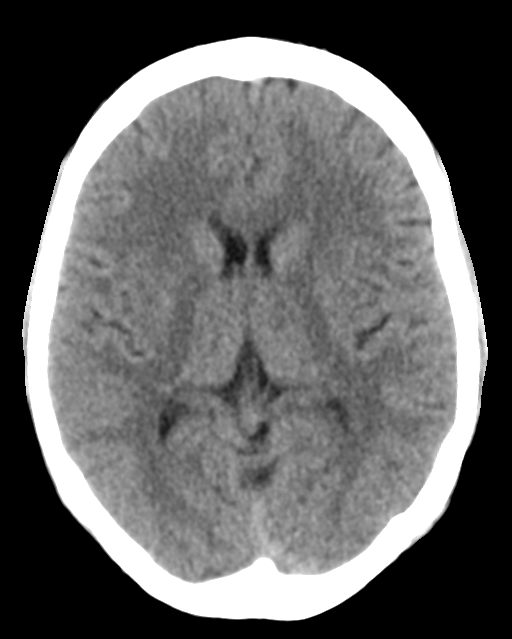
[im 17/29  brain]
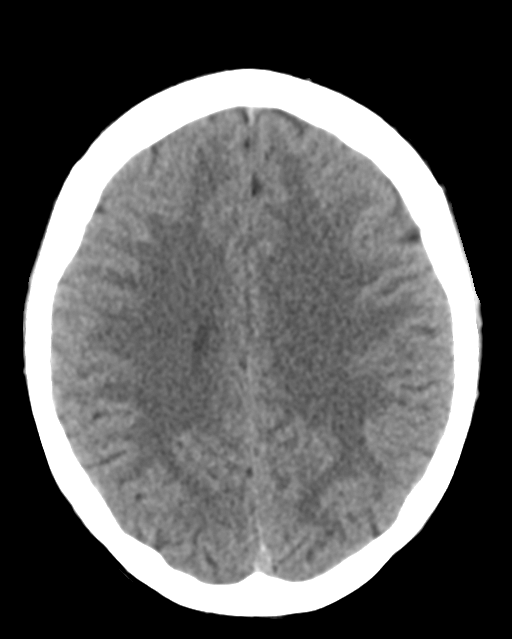
[im 17/29  bone]
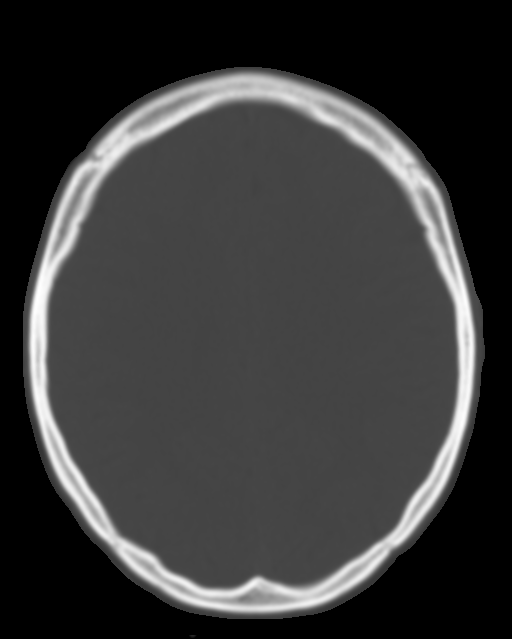
[im 20/29  brain]
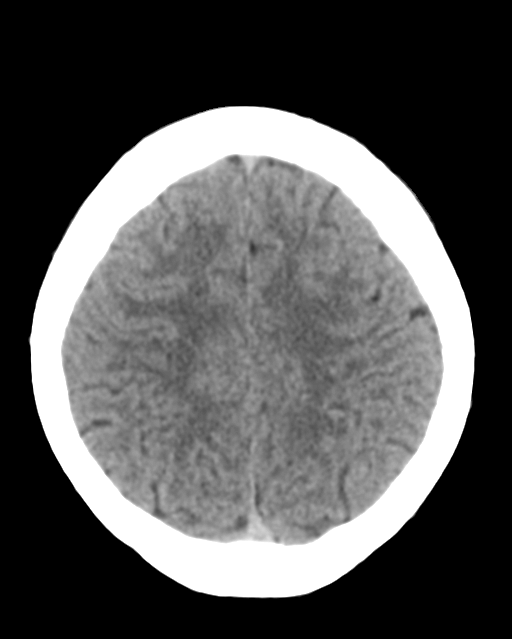
[im 23/29  brain]
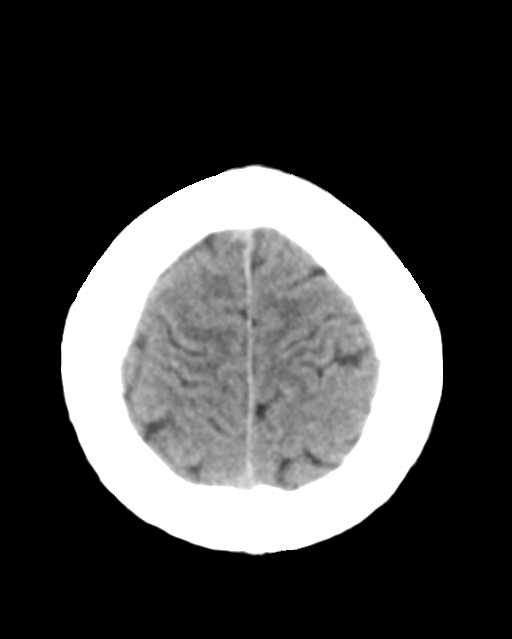
[im 27/29  brain]
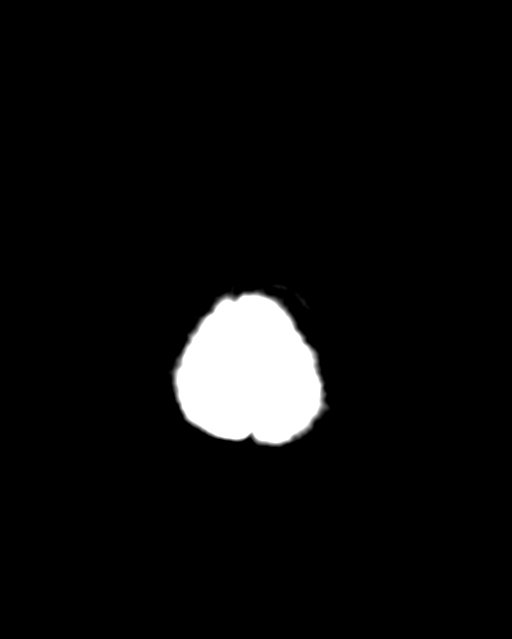

[Series 6: sagittal soft tissue · sagittal · 0.29mm/px · 3 of 50 slices shown]
[im 17/50  brain]
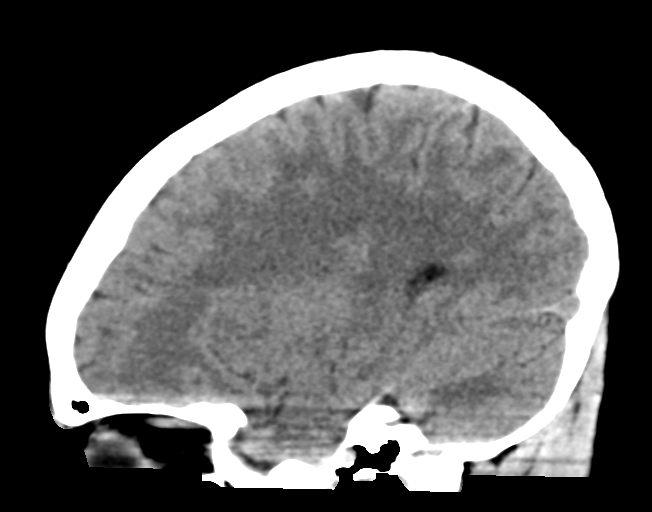
[im 25/50  brain]
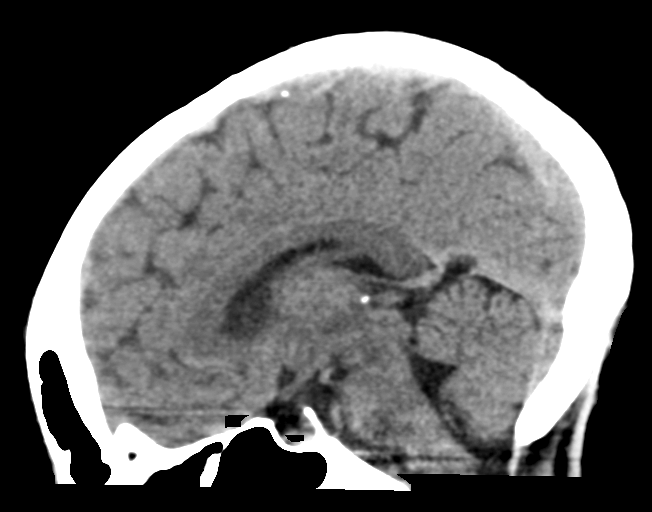
[im 33/50  brain]
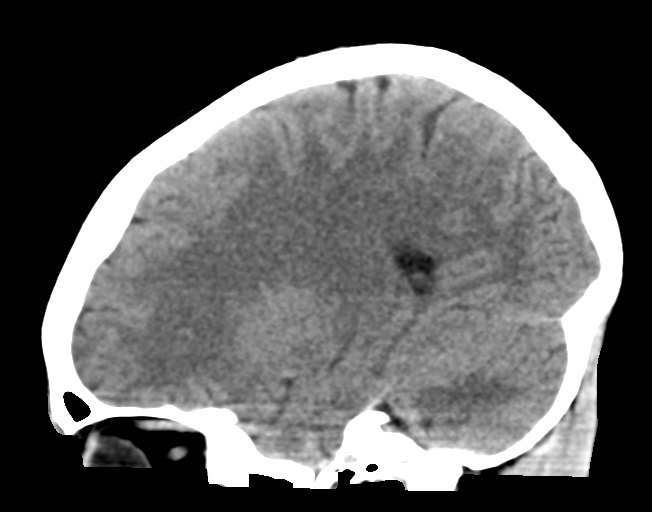

[14 of 46 positions shown; findings below may reference images not displayed]

FINDINGS: Brain: No evidence of acute large vascular territory infarction,
hemorrhage, hydrocephalus, extra-axial collection or mass
lesion/mass effect.

Vascular: No hyperdense vessel identified.

Skull: No acute fracture

Sinuses/Orbits: Frothy secretions in the

Other: No mastoid effusions.

ASPECTS ([place] Stroke Program Early CT Score) total score (0-10
with 10 being normal): 10.
IMPRESSION: 1. No evidence of acute large vascular territory infarct or acute
hemorrhage.
2. The patient's prior midbrain infarct was occult by CT and MRI
could provide more sensitive evaluation for acute infarct in the
brainstem if clinically indicated.

Code stroke imaging results were communicated on [DATE] at [DATE] to provider Dr. WEICH Via telephone, who verbally
acknowledged these results.

## 2021-05-23 MED ORDER — CLOPIDOGREL BISULFATE 75 MG PO TABS: 75.0000 mg | ORAL_TABLET | Freq: Every day | ORAL | Status: DC

## 2021-05-23 MED ORDER — ASPIRIN EC 325 MG PO TBEC: 325.0000 mg | DELAYED_RELEASE_TABLET | Freq: Every day | ORAL | Status: DC

## 2021-05-23 MED ORDER — CLOPIDOGREL BISULFATE 75 MG PO TABS: 300.0000 mg | ORAL_TABLET | Freq: Once | ORAL | Status: DC

## 2021-05-23 MED ORDER — ATORVASTATIN CALCIUM 20 MG PO TABS: 40.0000 mg | ORAL_TABLET | Freq: Every day | ORAL | Status: DC

## 2021-05-23 MED ORDER — SODIUM CHLORIDE 0.9% FLUSH: 3.0000 mL | Freq: Once | INTRAVENOUS | Status: DC

## 2021-05-23 MED ORDER — STROKE: EARLY STAGES OF RECOVERY BOOK: Freq: Once | Status: DC

## 2021-05-23 MED ORDER — ASPIRIN 300 MG RE SUPP: 300.0000 mg | Freq: Every day | RECTAL | Status: DC

## 2021-05-23 NOTE — ED Notes (Signed)
Patient refusing to stay. Signed AMA.

## 2021-05-23 NOTE — ED Provider Notes (Addendum)
[facility] Emergency Department Provider Note  Time seen: 5:43 PM  I have reviewed the triage vital signs and the nursing notes.   HISTORY  Chief Complaint Eye Pain and Code Stroke   HPI Jennifer Arellano is a 20 y.o. female with a past medical history of CVA presents to the emergency department for left eye symptoms.  According to the patient and record report back in December patient had a CVA as evidenced on MRI which affected her left eye.  Patient has pictures of herself at the time in which her left eye had a significant droop and visual disturbance.  Patient states her symptoms had completely resolved until around 3 PM today.  Patient states she took a nap around 3 PM awoke around 4:00 and states her friend noted that her left eye was drooping.  Patient does admit to alcohol and marijuana use today as well.  Patient states the left eye was drooping and vision seem to be somewhat blurry but those symptoms are all improving now per patient.  Patient was very concerned because these are the same symptoms that she had when she developed a stroke in December but had not the symptoms since.  Patient denies any weakness or numbness of any arm or leg, confusion or slurred speech.  Largely negative review of systems as well.  Past Medical History:  Diagnosis Date  . Cannabis use, uncomplicated     Patient Active Problem List   Diagnosis Date Noted  . Left oculomotor nerve palsy   . Marijuana abuse   . Vaping nicotine dependence, tobacco product   . 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 Dehydration 12/01/2020    Past Surgical History:  Procedure Laterality Date  . NO PAST SURGERIES    . TEE WITHOUT CARDIOVERSION N/A 12/04/2020   Procedure: TRANSESOPHAGEAL ECHOCARDIOGRAM (TEE);  Surgeon: Antonieta Iba, MD;  Location: [facility] ORS;  Service: Cardiovascular;  Laterality: N/A;    Prior to Admission medications   Medication Sig Start Date End Date Taking? Authorizing  Provider  aspirin EC 81 MG EC tablet Take 1 tablet (81 mg total) by mouth daily. Swallow whole. 12/04/20   Lynn Ito, MD  rosuvastatin (CRESTOR) 20 MG tablet Take 1 tablet (20 mg total) by mouth daily at 6 PM. 12/04/20 01/03/21  Lynn Ito, MD    No Known Allergies  Family History  Problem Relation Age of Onset  . High blood pressure Mother   . Asthma Father   . Stroke Maternal Grandmother   . Fibromyalgia Maternal Grandmother   . Arthritis Maternal Grandmother   . ADD / ADHD Sister     Social History Social History   Tobacco Use  . Smoking status: Current Every Day Smoker    Types: E-cigarettes  . Smokeless tobacco: Never Used  Substance Use Topics  . Alcohol use: Yes    Alcohol/week: 4.0 standard drinks    Types: 4 Shots of liquor per week  . Drug use: Yes    Types: Marijuana    Review of Systems Constitutional: Negative for fever. Eyes: Left eye visual blurriness now improving.  Left eye drooping now improved. ENT: Negative for recent illness/congestion Cardiovascular: Negative for chest pain. Respiratory: Negative for shortness of breath. Gastrointestinal: Negative for abdominal pain Musculoskeletal: Negative for musculoskeletal complaints Neurological: Negative for headache.  Negative for weakness or numbness of any arm or leg. All other ROS negative  ____________________________________________   PHYSICAL EXAM:  VITAL SIGNS: ED Triage Vitals [05/23/21 1658]  Enc Vitals Group     BP 123/87     Pulse Rate 83     Resp 18     Temp 98.2 F (36.8 C)     Temp Source Oral     SpO2 97 %     Weight 137 lb (62.1 kg)     Height 5\' 5"  (1.651 m)     Head Circumference      Peak Flow      Pain Score 0     Pain Loc      Pain Edu?      Excl. in GC?     Constitutional: Alert and oriented. Well appearing and in no distress. Eyes: Normal exam ENT      Head: Normocephalic and atraumatic.      Mouth/Throat: Mucous membranes are moist. Cardiovascular: Normal  rate, regular rhythm. No murmurs, rubs, or gallops. Respiratory: Normal respiratory effort without tachypnea nor retractions. Breath sounds are clear  Gastrointestinal: Soft and nontender. No distention.   Musculoskeletal: Nontender with normal range of motion in all extremities.  Neurologic:  Normal speech and language. No gross focal neurologic deficits.  Cranial nerves appear intact to my examination.  Extraocular muscles are intact.  Patient is vision appears normal currently.  No ptosis noted on my exam. Skin:  Skin is warm, dry and intact.  Psychiatric: Mood and affect are normal.   ____________________________________________    EKG  EKG viewed and interpreted by myself shows a normal sinus rhythm at 90 bpm with a narrow QRS, normal axis, normal intervals, no concerning ST changes.  ____________________________________________    RADIOLOGY  CT head is negative  ____________________________________________   INITIAL IMPRESSION / ASSESSMENT AND PLAN / ED COURSE  Pertinent labs & imaging results that were available during my care of the patient were reviewed by me and considered in my medical decision making (see chart for details).   Patient presents to the emergency department for left eye visual disturbance and droop.  Patient had similar symptoms in December when she had an MRI verified CVA.  Patient's symptoms appear to be improving no obvious deficit noted on my exam and the patient also admits symptoms are improving are almost back to normal.  However given the patient's history of a prior CVA and recurrence of similar symptoms a code stroke was activated.  Neurology is currently seeing the patient.  CT scan of the head is negative but also states that they could not see the prior CVA that was documented on MRI and that MRI would be a better test.  Lab work is pending.  We will discuss with neurology for further recommendations.  Current NIH stroke scale of 0.  Patient MRI  is consistent with an acute 1.5 cm left thalamic CVA.  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  I spoke to Dr. January of neurosurgery who states no acute treatment needed this would be an outpatient follow-up and likely unrelated to today's CVA.  Patient will be admitted to the hospital service for further work-up and treatment.  I discussed this with the patient who is reluctantly agreeable.  Patient now wishes to leave the emergency department does not want to stay for admission.  I had a long conversation with the patient regarding her stroke and the need for a full stroke work-up so we  can help prevent the next stroke from occurring or minimize the risk of having another stroke.  Patient states she understands but she is not going to stay in the hospital today.  Patient appears to comprehend the repercussions of not staying for a stroke work-up.  She has follow-up I believe next week with neurology.  Patient will sign out AGAINST MEDICAL ADVICE.  Jennifer Arellano was evaluated in Emergency Department on 05/23/2021 for the symptoms described in the history of present illness. She was evaluated in the context of the global COVID-19 pandemic, which necessitated consideration that the patient might be at risk for infection with the SARS-CoV-2 virus that causes COVID-19. Institutional protocols and algorithms that pertain to the evaluation of patients at risk for COVID-19 are in a state of rapid change based on information released by regulatory bodies including the CDC and federal and state organizations. These policies and algorithms were followed during the patient's care in the ED.  ____________________________________________   FINAL CLINICAL IMPRESSION(S) / ED DIAGNOSES  CVA   Minna Antis, MD 05/23/21 2039    Minna Antis, MD 05/23/21 2054

## 2021-05-23 NOTE — ED Notes (Signed)
Neurologist on telestroke at this time

## 2021-05-23 NOTE — ED Triage Notes (Addendum)
Patient c/o left eye feeling "lazy." This RN notes eye droop intermittently. No facial droop, strong bilateral hand grips, denies vision changes, no weakness, and speech clear. Admits to smoking marijuana everyday and having lazy eye. Smoked marijuana today and had some liquor. She reports a friend freaked her out about her left eye looking off. HX stroke in December 2021. Diagnosed here at [facility]

## 2021-05-23 NOTE — ED Notes (Signed)
Dr. Paduchowski at bedside.  

## 2021-05-23 NOTE — Consult Note (Addendum)
[facility] Telemedicine Consult   Requesting Provider: Dr. Kerman Passey  Chief Complaint: left ptosis  HPI:  20 year old female with history of stroke in 11/2020 presented to ER for left proptosis.   Per note, patient admitted in [facility] in 11/2020 for several days duration of left ptosis, double vision, imbalance and falling.  Exam showed left CN III palsy.  MRI brain showed 6 mm infarct in the left midbrain with slight cytotoxic edema.  CTA head and neck unremarkable.  2D echo showed EF 55 to 60%, TEE no cardioembolic source, no PFO.  ESR 16, CRP normal.  Hypercoagulable work-up negative.  HIV and COVID negative.  LDL 60 and A1c 5.2.  30-day event monitoring ordered but she only had 1 day recording, which was negative for A. fib.  She was loaded with Plavix, and put on DAPT for 3 weeks and then aspirin 81 alone.  Her stroke risk factor only including THC use and vaping.  Per patient, she stated that her symptoms improved over time and eventually resolved couple months ago.  She followed with [facility] Dr. Claris Gladden in 01/15/2021 and 04/15/2021, completed hypercoagulable work-up which was negative.  Ordered MRI, MRA head and neck which were done on 05/07/2021 and reported "a focal aneurysmal dilatation proximal to the left P1/P2 junction with the neck of 0.6 mm in diameter, 2.3 mm in length.  Fusiform dilatation distal left V4 segment with focal narrowing distal to PICA takeoff and proximal to the basilar confluence".  Normal MRA neck, recommend CTA head which was done 05/19/2021 and it reported "redemonstrated fusiform dilatation of left V4 with interval worsening stenosis, now severe of the VA just distal to the aneurysmal segment, now with hairline lumen just proximal to the VBJ.  There is additional area of stenosis of the V4 proximal to the aneurysm segment as seen on the prior.  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   Hypodensity within the left thalamus, not seen on prior studies, may be related to prior infarction."  Patient has appointment with [facility] on 05/26/2021.  Today, patient stated that she was in her baseline and she was 3 PM she laid down in her car around 3 PM.  It was very hot in the car, she woke up at 3:30 PM and her friend noticed she has left ptosis.  She denies any diplopia, dizziness, blurry vision, speech difficulty or motor or sensory deficit.  Given her previous stroke with left ptosis, she was sent to ER for evaluation.  She is supposed to take aspirin 81 and Crestor, however she was out of medication for 1 month.  BP 120s, glucose 89 in ER.  CT no acute abnormality.  On my exam, patient left ptosis is minimal, no extraocular movement deficit.  Patient felt much improved and near baseline.  She is asking to go home.  LKW: 3pm tpa given?: No, too mild nondisabling symptoms, rapid improving  IR Thrombectomy? No, low NIH, rapid improving Modified Rankin Scale: 1-No significant post stroke disability and can perform usual duties with stroke symptoms   Exam: Vitals:   05/23/21 1735 05/23/21 1800  BP: 112/82 122/83  Pulse: 71 70  Resp: 18 16  Temp:    SpO2: 100% 100%     Temp:  [98.2 F (36.8 C)] 98.2 F (36.8 C) (05/28 1658) Pulse Rate:  [70-83] 70 (05/28 1800) Resp:  [16-18] 16 (05/28 1800) BP: (112-123)/(82-87) 122/83 (05/28 1800) SpO2:  [  97 %-100 %] 100 % (05/28 1800) Weight:  [62.1 kg] 62.1 kg (05/28 1658)  General - Well nourished, well developed, in no apparent distress.  Ophthalmologic - fundi not visualized due to noncooperation.  Cardiovascular - Regular rhythm and rate.  Neuro - awake, alert, eyes open, orientated to age, place, time. No aphasia, fluent language, following all simple commands. Able to name and repeat and read. No gaze palsy in all directions, however, seems to have slight left eye downward deviation likely chronic, left eye slight ptosis not  sure if baseline, visual field full, PERRL. No facial droop. Tongue midline. Bilateral UEs 5/5, no drift. Bilaterally LEs 5/5, no drift. Sensation symmetrical bilaterally, b/l FTN intact, gait not tested.  Marland Kitchen   NIH Stroke Scale = 0  Imaging Reviewed:  CT HEAD CODE STROKE WO CONTRAST  Result Date: 05/23/2021 CLINICAL DATA:  Code stroke.  Neuro deficit, acute stroke suspected. EXAM: CT HEAD WITHOUT CONTRAST TECHNIQUE: Contiguous axial images were obtained from the base of the skull through the vertex without intravenous contrast. COMPARISON:  CT December 67, 2021. FINDINGS: Brain: No evidence of acute large vascular territory infarction, hemorrhage, hydrocephalus, extra-axial collection or mass lesion/mass effect. Vascular: No hyperdense vessel identified. Skull: No acute fracture Sinuses/Orbits: Frothy secretions in the Other: No mastoid effusions. ASPECTS [facility] Stroke Program Early CT Score) total score (0-10 with 10 being normal): 10. IMPRESSION: 1. No evidence of acute large vascular territory infarct or acute hemorrhage. 2. The patient's prior midbrain infarct was occult by CT and MRI could provide more sensitive evaluation for acute infarct in the brainstem if clinically indicated. Code stroke imaging results were communicated on 05/23/2021 at 5:31 pm to provider Dr. Kerman Passey Via telephone, who verbally acknowledged these results. Electronically Signed   By: Margaretha Sheffield MD   On: 05/23/2021 17:33    Assessment:  20 year old female with history of stroke in 11/2020 presented to ER for left ptosis.  Denies any diplopia, blurry vision, speech changes, or motor or sensory deficit.  Last seen well 3 PM.  Symptoms much improved in ER, on exam may have slight left ptosis and left eye downward deviation, could be chronic.  NIH score 0.  CT head no acute abnormality.  Patient not a tPA candidate given rapidly improving symptoms, nondisabling symptoms.  Etiology for patient symptoms could be  recrudescence from previous stroke in the setting of sleeping inside hot car and just woke up from sleep. However, given her hx of stroke, and recent CTA head and neck finding of posterior aneurysms, TIA/minor stroke can not be ruled out. Will recommend MRI brain to rule out stroke.  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  Okay to continue aspirin 81.   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   Consult Participants: stroke RN, beside RN, and me Location of the provider: [facility] Location of the patient: [facility]  This consult was provided via telemedicine with 2-way video and audio communication. The patient/family was informed that care would be provided in this way and agreed to receive care in this manner.   This patient is receiving care for possible acute neurological changes. There was 50 minutes of care by this provider at the time of service, including time for direct evaluation via telemedicine, review of medical records, imaging studies and discussion of findings with providers, the patient  and/or family.  Rosalin Hawking, MD PhD Stroke Neurology 05/23/2021 6:36 PM

## 2021-05-23 NOTE — ED Notes (Signed)
Returned from MRI 

## 2021-05-23 NOTE — ED Notes (Signed)
MRI screening completed.

## 2021-05-23 NOTE — ED Notes (Addendum)
Per symptoms and previous history, Dr. Lenard Lance gives verbal orders to call code stroke at this time. Consulting civil engineer, Diplomatic Services operational officer, and Leisure centre manager notified.

## 2021-05-23 NOTE — ED Notes (Signed)
Tried to Call code stroke to [employer] [facility]15 was told I needed to activate it through teleneurology and not call them first.  Charge Nurse Victorino Dike is going to use the cart to not delay care.

## 2021-05-23 NOTE — ED Notes (Signed)
Patient crying and reports she does not want to be admitted to hospital. MD notified and speaking with patient.

## 2021-11-30 ENCOUNTER — Other Ambulatory Visit: Payer: Self-pay | Admitting: Neurology

## 2021-11-30 DIAGNOSIS — I639 Cerebral infarction, unspecified: Secondary | ICD-10-CM

## 2024-11-01 ENCOUNTER — Ambulatory Visit: Payer: Self-pay

## 2024-12-09 ENCOUNTER — Ambulatory Visit
Admission: EM | Admit: 2024-12-09 | Discharge: 2024-12-09 | Disposition: A | Discharge status: Home / Self Care | Payer: Self-pay | Attending: Emergency Medicine | Admitting: Emergency Medicine

## 2024-12-09 ENCOUNTER — Encounter: Payer: Self-pay | Admitting: Emergency Medicine

## 2024-12-09 DIAGNOSIS — Z113 Encounter for screening for infections with a predominantly sexual mode of transmission: Secondary | ICD-10-CM

## 2024-12-09 DIAGNOSIS — J069 Acute upper respiratory infection, unspecified: Secondary | ICD-10-CM

## 2024-12-09 DIAGNOSIS — J029 Acute pharyngitis, unspecified: Secondary | ICD-10-CM

## 2024-12-09 DIAGNOSIS — R6889 Other general symptoms and signs: Secondary | ICD-10-CM

## 2024-12-09 LAB — POC COVID19/FLU A&B COMBO
Covid Antigen, POC: NEGATIVE
Influenza A Antigen, POC: NEGATIVE
Influenza B Antigen, POC: NEGATIVE

## 2024-12-09 LAB — POCT RAPID STREP A (OFFICE): Rapid Strep A Screen: NEGATIVE

## 2024-12-09 MED ORDER — IPRATROPIUM BROMIDE 0.06 % NA SOLN: 2.0000 | Freq: Four times a day (QID) | NASAL | 12 refills | Status: AC
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
## 2024-12-09 NOTE — Discharge Instructions (Signed)
 Your testing today is negative for COVID, influenza, and strep.  We will send your strep swab for culture.  Use the Atrovent  nasal spray, 2 squirts up each nostril every 6 hours, as needed for runny nose and nasal congestion.  Gargle with warm salt water 2-3 times a day to soothe your throat, aid in pain relief, and aid in healing.  Take over-the-counter ibuprofen according to the package instructions as needed for pain.  You can also use Chloraseptic or Sucrets lozenges, 1 lozenge every 2 hours as needed for throat pain.  Your blood work and swab will be back in the next several days.  If you test positive for any infection you will be contacted by phone and treatment options will be provided.  If your results are negative they will appear in your MyChart.  If you develop any new or worsening symptoms return for reevaluation.
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
## 2024-12-10 LAB — CYTOLOGY, (ORAL, ANAL, URETHRAL) ANCILLARY ONLY
Chlamydia: NEGATIVE
Comment: NEGATIVE
Comment: NORMAL
Neisseria Gonorrhea: NEGATIVE

## 2024-12-10 LAB — HIV ANTIBODY (ROUTINE TESTING W REFLEX): HIV Screen 4th Generation wRfx: NONREACTIVE

## 2024-12-10 LAB — SYPHILIS: RPR W/REFLEX TO RPR TITER AND TREPONEMAL ANTIBODIES, TRADITIONAL SCREENING AND DIAGNOSIS ALGORITHM: RPR Ser Ql: NONREACTIVE

## 2024-12-12 ENCOUNTER — Ambulatory Visit (HOSPITAL_COMMUNITY): Payer: Self-pay

## 2024-12-12 LAB — CULTURE, GROUP A STREP (THRC)
